# Patient Record
Sex: Male | Born: 1990 | Race: White | Hispanic: No | Marital: Single | Smoking: Current every day smoker
Health system: Southern US, Community
[De-identification: ages and names within clinical notes are randomized; demographics above are authoritative.]

---

## 2018-11-19 ENCOUNTER — Encounter (HOSPITAL_COMMUNITY): Payer: Self-pay | Admitting: Emergency Medicine

## 2018-11-19 ENCOUNTER — Emergency Department (HOSPITAL_COMMUNITY): Payer: No Typology Code available for payment source

## 2018-11-19 ENCOUNTER — Emergency Department (HOSPITAL_COMMUNITY)
Admission: EM | Admit: 2018-11-19 | Discharge: 2018-11-19 | Disposition: A | Discharge status: Home / Self Care | Payer: No Typology Code available for payment source | Attending: Emergency Medicine | Admitting: Emergency Medicine

## 2018-11-19 ENCOUNTER — Other Ambulatory Visit: Payer: Self-pay

## 2018-11-19 DIAGNOSIS — Y990 Civilian activity done for income or pay: Secondary | ICD-10-CM | POA: Diagnosis not present

## 2018-11-19 DIAGNOSIS — F17210 Nicotine dependence, cigarettes, uncomplicated: Secondary | ICD-10-CM | POA: Diagnosis not present

## 2018-11-19 DIAGNOSIS — S82841A Displaced bimalleolar fracture of right lower leg, initial encounter for closed fracture: Secondary | ICD-10-CM | POA: Diagnosis not present

## 2018-11-19 DIAGNOSIS — Y929 Unspecified place or not applicable: Secondary | ICD-10-CM | POA: Insufficient documentation

## 2018-11-19 DIAGNOSIS — Y939 Activity, unspecified: Secondary | ICD-10-CM | POA: Diagnosis not present

## 2018-11-19 DIAGNOSIS — Z23 Encounter for immunization: Secondary | ICD-10-CM | POA: Diagnosis not present

## 2018-11-19 DIAGNOSIS — W231XXA Caught, crushed, jammed, or pinched between stationary objects, initial encounter: Secondary | ICD-10-CM | POA: Diagnosis not present

## 2018-11-19 DIAGNOSIS — S99911A Unspecified injury of right ankle, initial encounter: Secondary | ICD-10-CM | POA: Diagnosis present

## 2018-11-19 MED ORDER — ACETAMINOPHEN 325 MG PO TABS
650.0000 mg | ORAL_TABLET | Freq: Once | ORAL | Status: AC
  Administered 2018-11-19: 650 mg via ORAL
  Filled 2018-11-19: qty 2

## 2018-11-19 MED ORDER — OXYCODONE-ACETAMINOPHEN 5-325 MG PO TABS: 1.0000 | ORAL_TABLET | Freq: Once | ORAL | Status: DC

## 2018-11-19 MED ORDER — TETANUS-DIPHTH-ACELL PERTUSSIS 5-2.5-18.5 LF-MCG/0.5 IM SUSP
0.5000 mL | Freq: Once | INTRAMUSCULAR | Status: AC
  Administered 2018-11-19: 0.5 mL via INTRAMUSCULAR
  Filled 2018-11-19: qty 0.5

## 2018-11-19 NOTE — ED Triage Notes (Addendum)
Per EMS pt from work with concern for broken right ankle. Pt has positive pedal pulse; swelling noted.  Pt given 100 mcg of fentanyl IV with EMS.
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
## 2018-11-19 NOTE — Discharge Instructions (Addendum)
You were noted to have fracture of your ankle. Please follow-up with orthopedic surgery later this week.  Keep the leg elevated and take pain medications as prescribed.

## 2020-04-04 IMAGING — CR RIGHT ANKLE - 2 VIEW
2 series · 2 of 2 positions shown · non-contrast
Comparison: None.

CLINICAL DATA: Diffuse right ankle pain and swelling after blunt
trauma today. Initial encounter.

EXAM:
RIGHT ANKLE - 2 VIEW

[x ankle ap right]
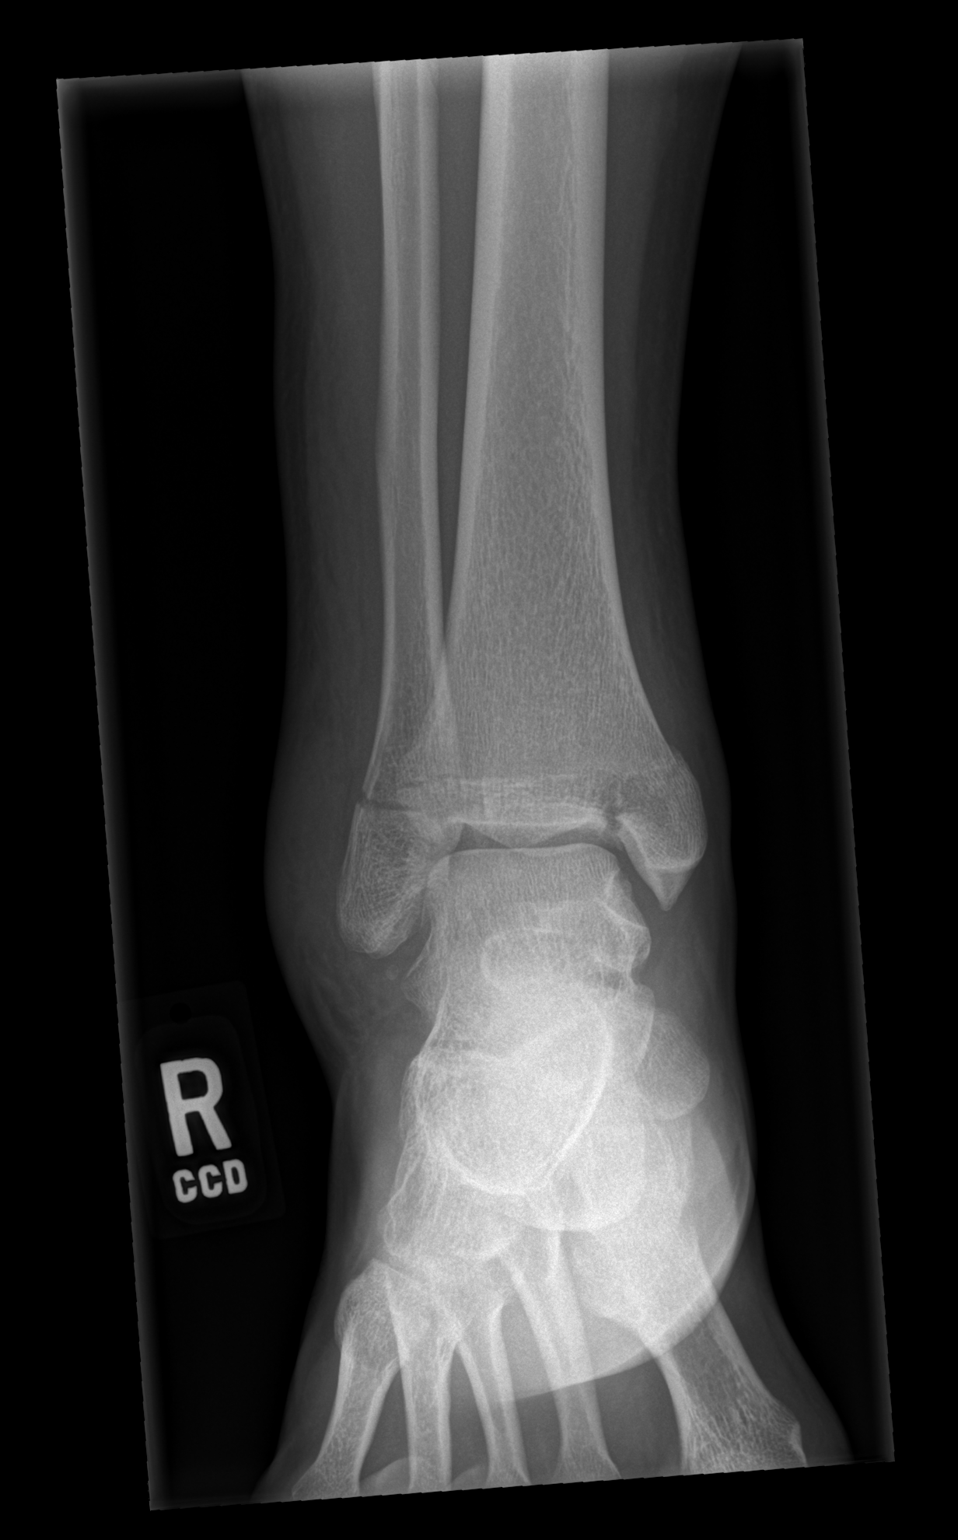

[x ankle lat right]
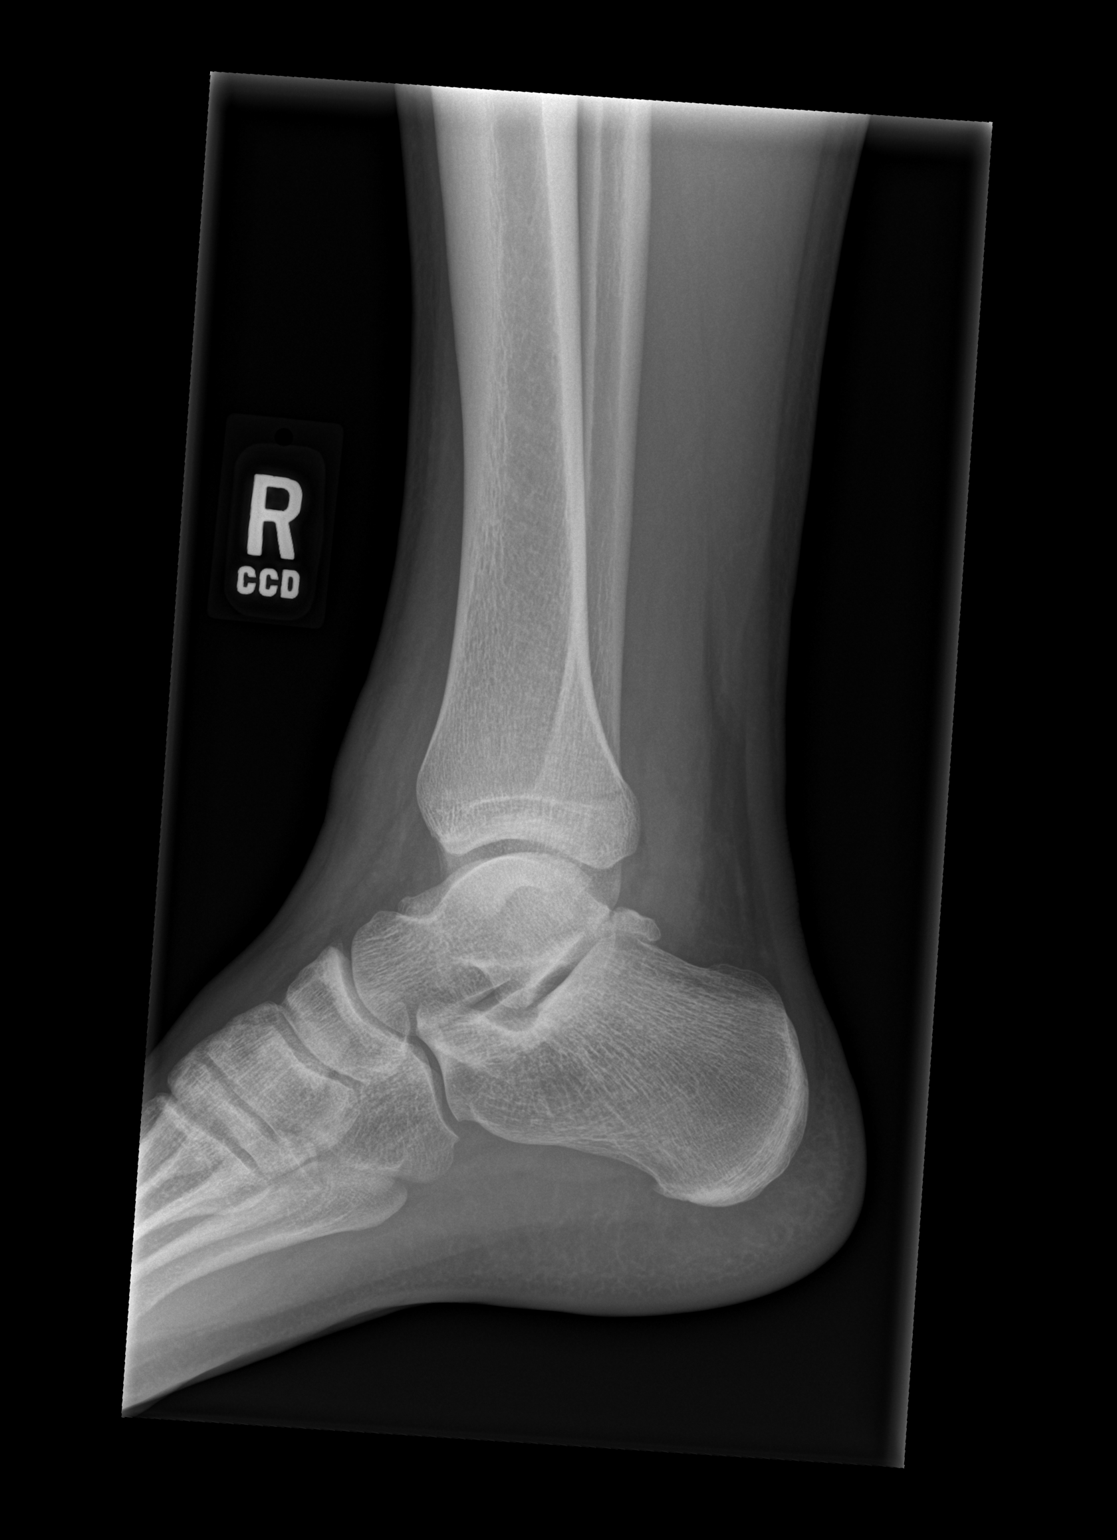

[2 of 2 positions shown; findings below may reference images not displayed]

FINDINGS: There is an oblique fracture of the medial malleolus, and there is a
transverse fracture of the lateral malleolus, each with at most
minimal displacement. There is no dislocation. Diffuse soft tissue
swelling about the ankle is most notable anteriorly and laterally.
IMPRESSION: Bimalleolar fracture of the right ankle.
